# Patient Record
Sex: Female | Born: 1965 | Hispanic: No | Marital: Single | Smoking: Current every day smoker
Health system: Southern US, Community
[De-identification: ages and names within clinical notes are randomized; demographics above are authoritative.]

## PROBLEM LIST (undated history)

## (undated) DIAGNOSIS — F32A Depression, unspecified: Secondary | ICD-10-CM

## (undated) DIAGNOSIS — G43909 Migraine, unspecified, not intractable, without status migrainosus: Secondary | ICD-10-CM

## (undated) DIAGNOSIS — G35 Multiple sclerosis: Secondary | ICD-10-CM

## (undated) DIAGNOSIS — R42 Dizziness and giddiness: Secondary | ICD-10-CM

## (undated) DIAGNOSIS — E119 Type 2 diabetes mellitus without complications: Secondary | ICD-10-CM

## (undated) DIAGNOSIS — F329 Major depressive disorder, single episode, unspecified: Secondary | ICD-10-CM

## (undated) DIAGNOSIS — G35D Multiple sclerosis, unspecified: Secondary | ICD-10-CM

---

## 2004-10-19 ENCOUNTER — Emergency Department (HOSPITAL_COMMUNITY): Admission: EM | Admit: 2004-10-19 | Discharge: 2004-10-19 | Payer: Self-pay | Admitting: Emergency Medicine

## 2005-01-19 ENCOUNTER — Other Ambulatory Visit: Admission: RE | Admit: 2005-01-19 | Discharge: 2005-01-19 | Payer: Self-pay | Admitting: Internal Medicine

## 2006-07-04 ENCOUNTER — Encounter: Admission: RE | Admit: 2006-07-04 | Discharge: 2006-07-04 | Payer: Self-pay | Admitting: Internal Medicine

## 2006-07-06 ENCOUNTER — Encounter: Admission: RE | Admit: 2006-07-06 | Discharge: 2006-07-06 | Payer: Self-pay | Admitting: *Deleted

## 2008-10-27 ENCOUNTER — Emergency Department (HOSPITAL_COMMUNITY): Admission: EM | Admit: 2008-10-27 | Discharge: 2008-10-27 | Payer: Self-pay | Admitting: Emergency Medicine

## 2010-02-20 ENCOUNTER — Emergency Department (HOSPITAL_COMMUNITY): Admission: EM | Admit: 2010-02-20 | Discharge: 2010-02-21 | Payer: Self-pay | Admitting: Emergency Medicine

## 2010-10-23 LAB — URINALYSIS, ROUTINE W REFLEX MICROSCOPIC
Glucose, UA: NEGATIVE mg/dL
Hgb urine dipstick: NEGATIVE
Leukocytes, UA: NEGATIVE
Protein, ur: 30 mg/dL — AB
Specific Gravity, Urine: 1.031 — ABNORMAL HIGH (ref 1.005–1.030)

## 2010-10-23 LAB — URINE MICROSCOPIC-ADD ON

## 2010-10-23 LAB — POCT PREGNANCY, URINE: Preg Test, Ur: NEGATIVE

## 2011-01-05 ENCOUNTER — Other Ambulatory Visit: Payer: Self-pay | Admitting: Obstetrics

## 2011-01-05 DIAGNOSIS — Z1231 Encounter for screening mammogram for malignant neoplasm of breast: Secondary | ICD-10-CM

## 2011-01-14 ENCOUNTER — Ambulatory Visit (HOSPITAL_COMMUNITY): Payer: Self-pay | Attending: Obstetrics

## 2014-03-20 ENCOUNTER — Other Ambulatory Visit (HOSPITAL_COMMUNITY)
Admission: RE | Admit: 2014-03-20 | Discharge: 2014-03-20 | Disposition: A | Discharge status: Home / Self Care | Payer: Medicaid Other | Source: Ambulatory Visit | Attending: Family Medicine | Admitting: Family Medicine

## 2014-03-20 DIAGNOSIS — Z01419 Encounter for gynecological examination (general) (routine) without abnormal findings: Secondary | ICD-10-CM | POA: Diagnosis present

## 2014-03-20 DIAGNOSIS — Z1151 Encounter for screening for human papillomavirus (HPV): Secondary | ICD-10-CM | POA: Diagnosis present

## 2014-03-20 DIAGNOSIS — N760 Acute vaginitis: Secondary | ICD-10-CM | POA: Diagnosis present

## 2014-03-20 DIAGNOSIS — Z113 Encounter for screening for infections with a predominantly sexual mode of transmission: Secondary | ICD-10-CM | POA: Diagnosis present

## 2015-07-15 ENCOUNTER — Ambulatory Visit: Payer: Medicaid Other | Admitting: *Deleted

## 2016-05-26 ENCOUNTER — Ambulatory Visit
Admission: RE | Admit: 2016-05-26 | Discharge: 2016-05-26 | Disposition: A | Discharge status: Home / Self Care | Payer: Medicaid Other | Source: Ambulatory Visit | Attending: Family Medicine | Admitting: Family Medicine

## 2016-05-26 ENCOUNTER — Other Ambulatory Visit: Payer: Self-pay | Admitting: Family Medicine

## 2016-05-26 DIAGNOSIS — Z87891 Personal history of nicotine dependence: Secondary | ICD-10-CM

## 2016-05-26 DIAGNOSIS — F17200 Nicotine dependence, unspecified, uncomplicated: Secondary | ICD-10-CM

## 2016-10-26 ENCOUNTER — Emergency Department (HOSPITAL_COMMUNITY)
Admission: EM | Admit: 2016-10-26 | Discharge: 2016-10-27 | Disposition: A | Discharge status: Home / Self Care | Payer: Medicaid Other | Attending: Emergency Medicine | Admitting: Emergency Medicine

## 2016-10-26 ENCOUNTER — Encounter (HOSPITAL_COMMUNITY): Payer: Self-pay

## 2016-10-26 DIAGNOSIS — E119 Type 2 diabetes mellitus without complications: Secondary | ICD-10-CM | POA: Insufficient documentation

## 2016-10-26 DIAGNOSIS — F17200 Nicotine dependence, unspecified, uncomplicated: Secondary | ICD-10-CM | POA: Diagnosis not present

## 2016-10-26 DIAGNOSIS — R42 Dizziness and giddiness: Secondary | ICD-10-CM | POA: Insufficient documentation

## 2016-10-26 DIAGNOSIS — Z79899 Other long term (current) drug therapy: Secondary | ICD-10-CM | POA: Diagnosis not present

## 2016-10-26 DIAGNOSIS — Z7984 Long term (current) use of oral hypoglycemic drugs: Secondary | ICD-10-CM | POA: Diagnosis not present

## 2016-10-26 HISTORY — DX: Type 2 diabetes mellitus without complications: E11.9

## 2016-10-26 HISTORY — DX: Migraine, unspecified, not intractable, without status migrainosus: G43.909

## 2016-10-26 HISTORY — DX: Dizziness and giddiness: R42

## 2016-10-26 HISTORY — DX: Depression, unspecified: F32.A

## 2016-10-26 HISTORY — DX: Multiple sclerosis: G35

## 2016-10-26 HISTORY — DX: Multiple sclerosis, unspecified: G35.D

## 2016-10-26 HISTORY — DX: Major depressive disorder, single episode, unspecified: F32.9

## 2016-10-26 LAB — BASIC METABOLIC PANEL
ANION GAP: 8 (ref 5–15)
BUN: 12 mg/dL (ref 6–20)
CHLORIDE: 105 mmol/L (ref 101–111)
CO2: 29 mmol/L (ref 22–32)
Calcium: 9.7 mg/dL (ref 8.9–10.3)
Creatinine, Ser: 0.88 mg/dL (ref 0.44–1.00)
GFR calc non Af Amer: >60 mL/min (ref >60)
Glucose, Bld: 119 mg/dL — ABNORMAL HIGH (ref 65–99)
POTASSIUM: 3.6 mmol/L (ref 3.5–5.1)
SODIUM: 142 mmol/L (ref 135–145)

## 2016-10-26 LAB — URINALYSIS, ROUTINE W REFLEX MICROSCOPIC
BILIRUBIN URINE: NEGATIVE
Glucose, UA: NEGATIVE mg/dL
Hgb urine dipstick: NEGATIVE
KETONES UR: NEGATIVE mg/dL
LEUKOCYTES UA: NEGATIVE
NITRITE: NEGATIVE
Protein, ur: NEGATIVE mg/dL
SPECIFIC GRAVITY, URINE: 1.02 (ref 1.005–1.030)
pH: 5 (ref 5.0–8.0)

## 2016-10-26 LAB — CBC
HEMATOCRIT: 42.6 % (ref 36.0–46.0)
Hemoglobin: 13.9 g/dL (ref 12.0–15.0)
MCH: 28.2 pg (ref 26.0–34.0)
MCHC: 32.6 g/dL (ref 30.0–36.0)
MCV: 86.4 fL (ref 78.0–100.0)
Platelets: 260 10*3/uL (ref 150–400)
RBC: 4.93 MIL/uL (ref 3.87–5.11)
RDW: 12.8 % (ref 11.5–15.5)
WBC: 7.2 10*3/uL (ref 4.0–10.5)

## 2016-10-26 MED ORDER — DIAZEPAM 5 MG PO TABS: 5.0000 mg | ORAL_TABLET | Freq: Once | ORAL | Status: DC

## 2016-10-26 MED ORDER — MECLIZINE HCL 25 MG PO TABS
25.0000 mg | ORAL_TABLET | Freq: Once | ORAL | Status: AC
  Administered 2016-10-27: 25 mg via ORAL
  Filled 2016-10-26: qty 1

## 2016-10-26 MED ORDER — SODIUM CHLORIDE 0.9 % IV BOLUS (SEPSIS)
1000.0000 mL | Freq: Once | INTRAVENOUS | Status: AC
  Administered 2016-10-27: 1000 mL via INTRAVENOUS

## 2016-10-26 NOTE — ED Provider Notes (Signed)
[facility]-EMERGENCY DEPT Provider Note   CSN: [id number] Arrival date & time: 10/26/16  4098  By signing my name below, I, Teofilo Pod, attest that this documentation has been prepared under the direction and in the presence of Shon Baton, MD . Electronically Signed: Teofilo Pod, ED Scribe. 10/26/2016. 11:27 PM.    History   Chief Complaint Chief Complaint  Patient presents with  . Dizziness   The history is provided by the patient. No language interpreter was used.   HPI Comments:  Sophia Nguyen is a 51 y.o. female with PMHx of vertigo and MS who presents to the Emergency Department complaining of constant dizziness x 2 weeks. Pt descries the dizziness as "extreme" and similar to "car/sea sickness," and states that she has similar symptoms when she has migraines, but denies any current migraine. She complains of associated nausea, and pain behind her ears, neck and shoulders for several week. She states that her current dizziness and nausea is different from previous episodes of vertigo. She states that the dizziness is made worse with moving her head rapidly and looking at screens. Pt has MS and takes Tecfidera, and states that she normally has general weakness, but denies having any recent MS flare ups. Her PCP states that her current pain is not associated with her MS. Pt is a smoker but does not drink EtOH. No alleviating factors noted. Pt denies any congestion.   Past Medical History:  Diagnosis Date  . Depression   . Diabetes mellitus without complication (HCC)   . Migraine   . Multiple sclerosis (HCC)   . Vertigo     There are no active problems to display for this patient.   History reviewed. No pertinent surgical history.  OB History    No data available       Home Medications    Prior to Admission medications   Medication Sig Start Date End Date Taking? Authorizing Provider  atorvastatin (LIPITOR) 40 MG tablet Take 40 mg by mouth every  evening.   Yes Historical Provider, MD  buPROPion (WELLBUTRIN XL) 150 MG 24 hr tablet Take 150 mg by mouth daily. 10/07/16  Yes Historical Provider, MD  Cholecalciferol (VITAMIN D3 PO) Take 1 tablet by mouth daily.   Yes Historical Provider, MD  Cyanocobalamin (VITAMIN B-12 PO) Take 1 tablet by mouth daily.   Yes Historical Provider, MD  Dimethyl Fumarate 240 MG CPDR Take 240 mg by mouth 2 (two) times daily. 09/15/16  Yes Historical Provider, MD  lisdexamfetamine (VYVANSE) 70 MG capsule Take 70 mg by mouth daily. 10/26/16  Yes Historical Provider, MD  Melatonin 3 MG TABS Take 3 mg by mouth at bedtime.   Yes Historical Provider, MD  metFORMIN (GLUCOPHAGE) 850 MG tablet Take 850 mg by mouth daily. 10/07/16  Yes Historical Provider, MD  NEXIUM 40 MG capsule Take 40 mg by mouth daily. 10/11/16  Yes Historical Provider, MD  VIIBRYD 20 MG TABS Take 20 mg by mouth daily. 10/14/16  Yes Historical Provider, MD    Family History No family history on file.  Social History Social History  Substance Use Topics  . Smoking status: Current Every Day Smoker  . Smokeless tobacco: Never Used  . Alcohol use No     Allergies   Patient has no known allergies.   Review of Systems Review of Systems  Constitutional: Negative for fever.  HENT: Negative for congestion.   Gastrointestinal: Positive for nausea.  Musculoskeletal: Positive for myalgias and neck  pain.  Neurological: Positive for dizziness and weakness.  All other systems reviewed and are negative.    Physical Exam Updated Vital Signs BP 102/76   Pulse 95   Temp 97.8 F (36.6 C) (Oral)   Resp 16   Ht 4' 11.5" (1.511 m)   Wt 183 lb (83 kg)   SpO2 93%   BMI 36.34 kg/m   Physical Exam  Constitutional: She is oriented to person, place, and time. She appears well-developed and well-nourished. No distress.  HENT:  Head: Normocephalic and atraumatic.  Eyes: EOM are normal. Pupils are equal, round, and reactive to light.  No nystagmus noted    Neck: Normal range of motion. Neck supple.  Cardiovascular: Normal rate, regular rhythm and normal heart sounds.   No murmur heard. Pulmonary/Chest: Effort normal and breath sounds normal. No respiratory distress. She has no wheezes.  Abdominal: Soft. There is no tenderness.  Neurological: She is alert and oriented to person, place, and time.  Cranial nerves II through XII intact, 5 out of 5 strength in all 4 extremities, no dysmetria to finger-nose-finger  Skin: Skin is warm and dry.  Psychiatric: She has a normal mood and affect.  Nursing note and vitals reviewed.    ED Treatments / Results  DIAGNOSTIC STUDIES:  Oxygen Saturation is 97% on RA, normal by my interpretation.    COORDINATION OF CARE:  11:27 PM Discussed treatment plan with pt at bedside and pt agreed to plan.   Labs (all labs ordered are listed, but only abnormal results are displayed) Labs Reviewed  BASIC METABOLIC PANEL - Abnormal; Notable for the following:       Result Value   Glucose, Bld 119 (*)    All other components within normal limits  CBC  URINALYSIS, ROUTINE W REFLEX MICROSCOPIC    EKG  EKG Interpretation  Date/Time:  Wednesday October 26 2016 20:20:51 EDT Ventricular Rate:  84 PR Interval:  126 QRS Duration: 86 QT Interval:  362 QTC Calculation: 427 R Axis:   30 Text Interpretation:  Normal sinus rhythm Nonspecific T wave abnormality Abnormal ECG Confirmed by Wilkie AyeHORTON  MD, Toni AmendOURTNEY ([id number]) on 10/26/2016 11:59:18 PM       Radiology Mr Brain Wo Contrast  Result Date: 10/27/2016 CLINICAL DATA:  Multiple sclerosis and vertigo EXAM: MRI HEAD WITHOUT CONTRAST TECHNIQUE: Multiplanar, multiecho pulse sequences of the brain and surrounding structures were obtained without intravenous contrast. COMPARISON:  Head CT 02/20/2010 FINDINGS: Brain: No focal diffusion restriction to indicate acute infarct. No intraparenchymal hemorrhage. There are numerous (greater than 20) hyperintense T2 weighted signal  lesions within the periventricular, deep and subcortical white matter, many of which are pericallosal and oriented perpendicularly to the long axis of the lateral ventricles. There is a small lesion within the pons. No infratentorial lesions are otherwise identified. No mass lesion or midline shift. No hydrocephalus or extra-axial fluid collection. The midline structures are normal. No age advanced or lobar predominant atrophy. Vascular: Major intracranial arterial and venous sinus flow voids are preserved. No evidence of chronic microhemorrhage or amyloid angiopathy. Skull and upper cervical spine: The visualized skull base, calvarium, upper cervical spine and extracranial soft tissues are normal. Sinuses/Orbits: No fluid levels or advanced mucosal thickening. No mastoid effusion. Normal orbits. IMPRESSION: 1. Numerous (greater than 20) white matter lesions in a pattern consistent with multiple sclerosis. 2. No acute ischemia. Electronically Signed   By: Deatra RobinsonKevin  Herman M.D.   On: 10/27/2016 00:57    Procedures Procedures (including critical care time)  Medications Ordered in ED Medications  diazepam (VALIUM) tablet 5 mg (5 mg Oral Not Given 10/27/16 0304)  sodium chloride 0.9 % bolus 1,000 mL (0 mLs Intravenous Stopped 10/27/16 0305)  meclizine (ANTIVERT) tablet 25 mg (25 mg Oral Given 10/27/16 0201)  prochlorperazine (COMPAZINE) injection 10 mg (10 mg Intravenous Given 10/27/16 0201)  diphenhydrAMINE (BENADRYL) injection 25 mg (25 mg Intravenous Given 10/27/16 0201)  dexamethasone (DECADRON) injection 10 mg (10 mg Intravenous Given 10/27/16 0205)     Initial Impression / Assessment and Plan / ED Course  I have reviewed the triage vital signs and the nursing notes.  Pertinent labs & imaging results that were available during my care of the patient were reviewed by me and considered in my medical decision making (see chart for details).  Clinical Course as of Oct 28 339  Thu Oct 27, 2016  0106 MRI  shows consistent with MS. No prior for comparison. On recheck, no improvement with Valium and meclizine. Patient was dosed Compazine, Benadryl, and Decadron. Will reassess.  [CH]    Clinical Course User Index [CH] Shon Baton, MD    A shunt present with dizziness. Ongoing for 2 weeks. Reports history of the same. Unclear correlation with her MS. She is nonfocal on exam. No signs or symptoms of cerebellar dysfunction. MRI was obtained given her history of MS. MRI just shows lesions consistent with MS. No prior for comparison. Patient continued to have dizziness and was redosed Compazine, Benadryl, and Decadron.  On recheck, patient states that she feels much better. She is ambulatory independently. She is neuro intact. Encouraged close follow-up with her neurologist.  After history, exam, and medical workup I feel the patient has been appropriately medically screened and is safe for discharge home. Pertinent diagnoses were discussed with the patient. Patient was given return precautions.   Final Clinical Impressions(s) / ED Diagnoses   Final diagnoses:  Dizziness    New Prescriptions New Prescriptions   No medications on file   I personally performed the services described in this documentation, which was scribed in my presence. The recorded information has been reviewed and is accurate.     Shon Baton, MD 10/27/16 [phone]

## 2016-10-26 NOTE — ED Triage Notes (Signed)
Pt states that for the past two weeks she has been having dizziness, pt states that the dizziness is better when she lies down and cuts off the lights, similar to her migraines, pt endorses that she also has vertigo. States pain in shoulder, behind ears and neck.

## 2016-10-26 NOTE — ED Notes (Signed)
Pt transported to MRI 

## 2016-10-26 NOTE — ED Notes (Signed)
EDP at bedside  

## 2016-10-26 NOTE — ED Notes (Signed)
Pt up to nurse first desk, requesting update. Provided answers to questions and apologized for wait.

## 2016-10-27 ENCOUNTER — Emergency Department (HOSPITAL_COMMUNITY): Payer: Medicaid Other

## 2016-10-27 MED ORDER — DEXAMETHASONE SODIUM PHOSPHATE 4 MG/ML IJ SOLN
10.0000 mg | Freq: Once | INTRAMUSCULAR | Status: AC
  Administered 2016-10-27: 10 mg via INTRAVENOUS
  Filled 2016-10-27: qty 3

## 2016-10-27 MED ORDER — DIPHENHYDRAMINE HCL 50 MG/ML IJ SOLN
25.0000 mg | Freq: Once | INTRAMUSCULAR | Status: AC
  Administered 2016-10-27: 25 mg via INTRAVENOUS
  Filled 2016-10-27: qty 1

## 2016-10-27 MED ORDER — PROCHLORPERAZINE EDISYLATE 5 MG/ML IJ SOLN
10.0000 mg | Freq: Once | INTRAMUSCULAR | Status: AC
  Administered 2016-10-27: 10 mg via INTRAVENOUS
  Filled 2016-10-27: qty 2

## 2016-10-27 NOTE — ED Notes (Signed)
Iv started  Med given orthostatics

## 2016-10-27 NOTE — Discharge Instructions (Signed)
You were seen today for dizziness. You improved with vertigo medications. It is unclear whether your dizziness is related to your MS or peripheral vertigo. Follow-up with your neurologist closely. If you have any new or worsening symptoms you need to be reevaluated.

## 2016-10-27 NOTE — ED Notes (Signed)
Pt returned from MRI °

## 2016-10-27 NOTE — ED Notes (Signed)
Passes swallow screen

## 2017-11-10 IMAGING — MR MR HEAD W/O CM
9 of 11 series · 31 of 48 positions shown · non-contrast
Comparison: Head CT 02/20/2010

CLINICAL DATA: Multiple sclerosis and vertigo

EXAM:
MRI HEAD WITHOUT CONTRAST
TECHNIQUE: Multiplanar, multiecho pulse sequences of the brain and surrounding
structures were obtained without intravenous contrast.

[Series 2: FLAIR · sagittal · 5.0mm · 0.47mm/px · 1 of 23 slices shown (1 of 3)]
[im 1/23]
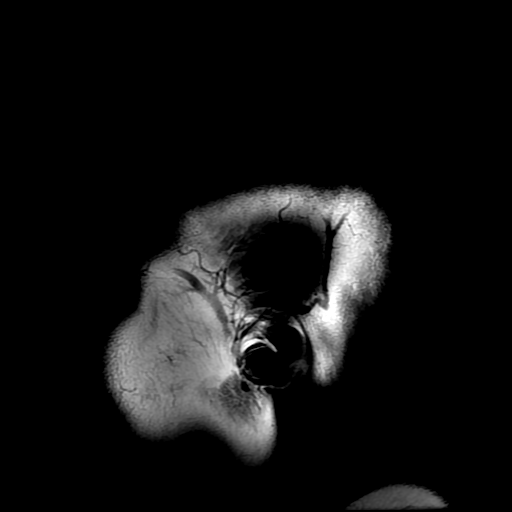

[Series 4: DWI · axial · 3.0mm · 0.94mm/px · z∈[-25,+122]mm · 7 of 100 slices shown (1 of 2)]
[im 1/100]
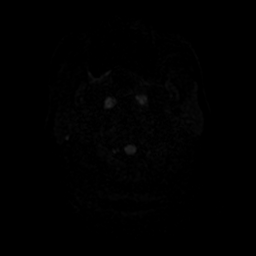
[im 17/100]
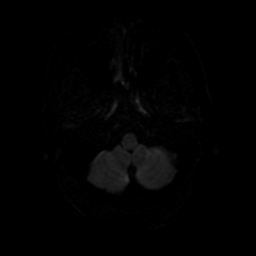
[im 34/100]
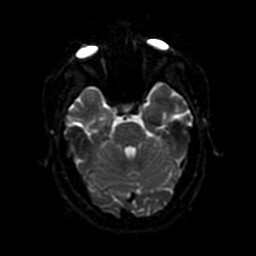
[im 50/100]
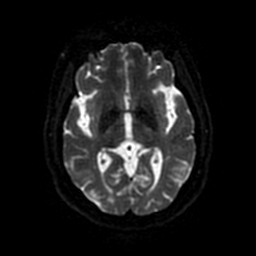
[im 67/100]
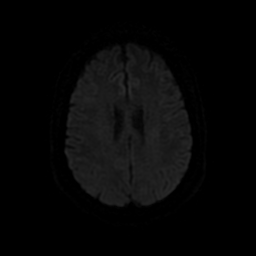
[im 83/100]
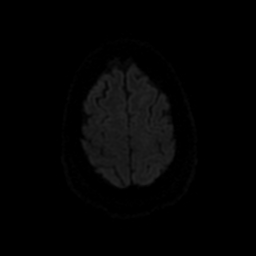
[im 100/100]
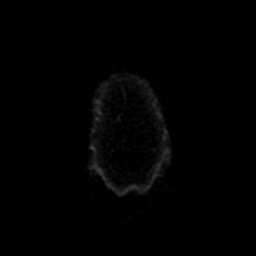

[Series 5: T2 · axial · 5.0mm · 0.43mm/px · z∈[-24,+120]mm · 2 of 25 slices shown (1 of 2)]
[im 1/25]
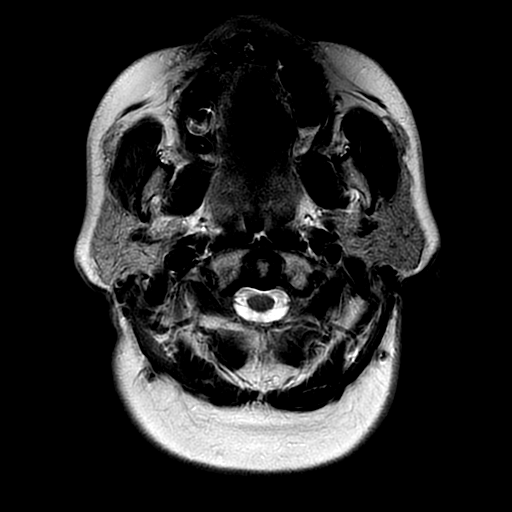
[im 25/25]
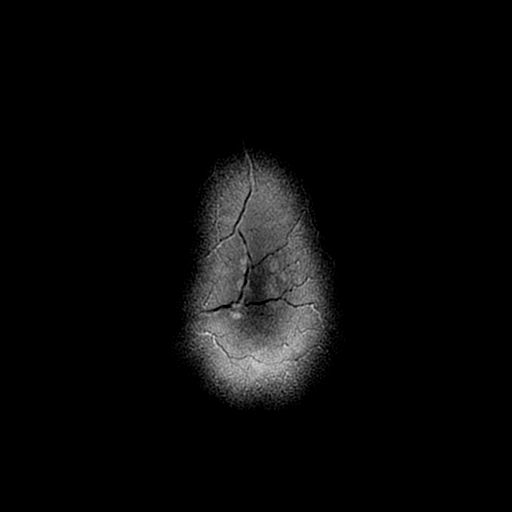

[Series 6: FLAIR · axial · 5.0mm · 0.43mm/px · z∈[-24,+120]mm · 2 of 25 slices shown (2 of 3)]
[im 1/25]
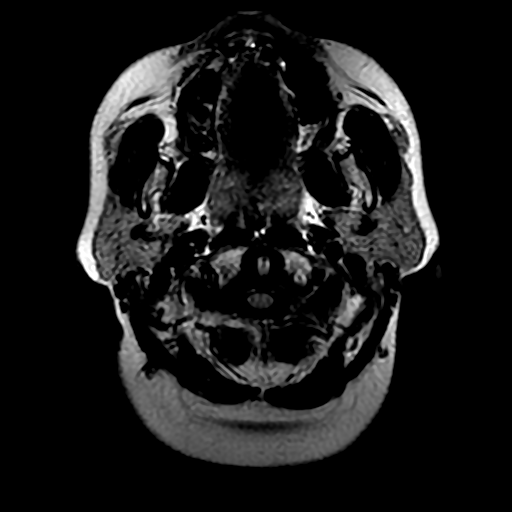
[im 25/25]
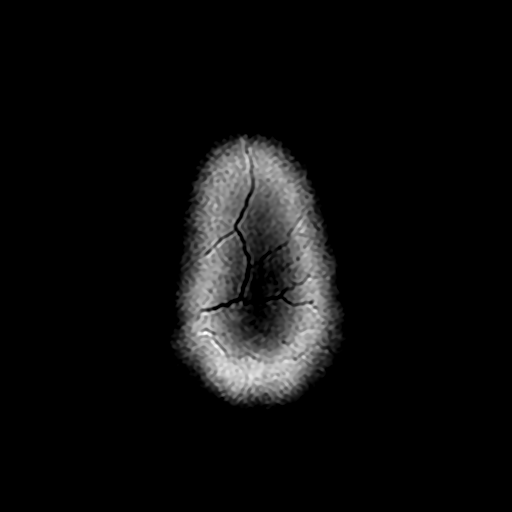

[Series 7: DWI · coronal · 4.0mm · 0.94mm/px · 5 of 72 slices shown (2 of 2)]
[im 1/72]
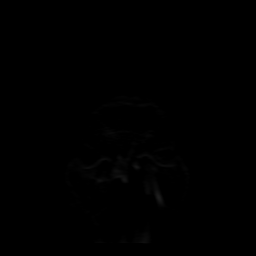
[im 18/72]
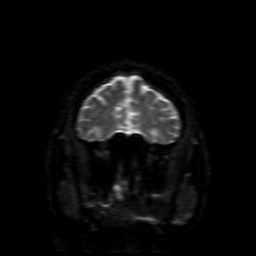
[im 36/72]
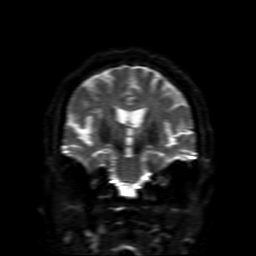
[im 54/72]
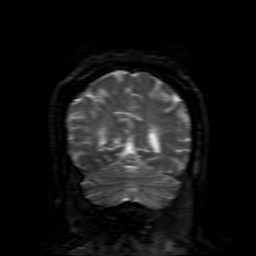
[im 72/72]
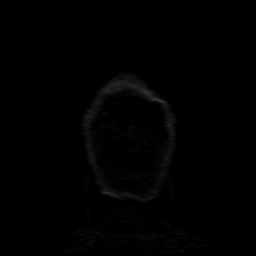

[Series 10: T2 · coronal · 5.0mm · 0.39mm/px · 1 of 30 slices shown (2 of 2)]
[im 1/30]
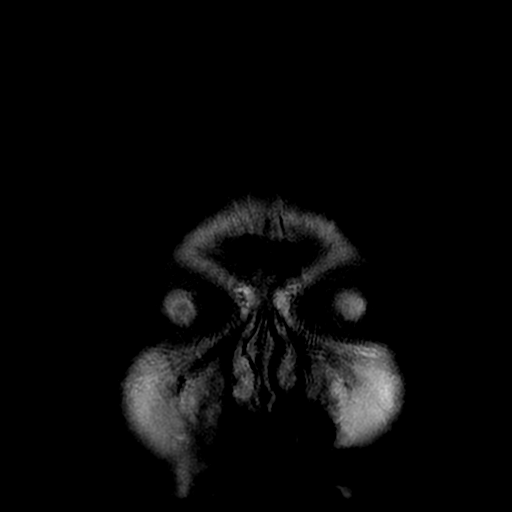

[Series 11: FLAIR · sagittal · 1.6mm · 0.49mm/px · 8 of 216 slices shown (3 of 3)]
[im 1/216]
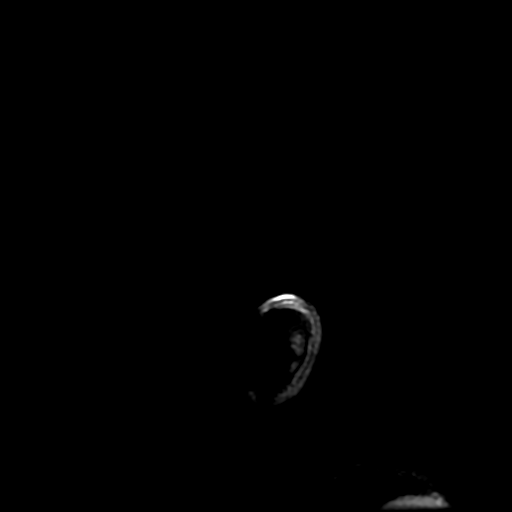
[im 34/216]
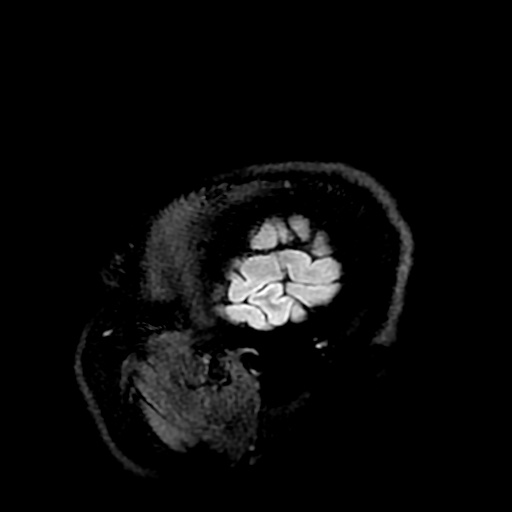
[im 67/216]
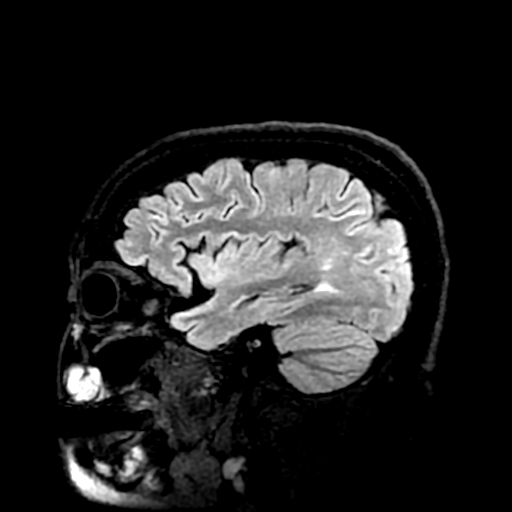
[im 100/216]
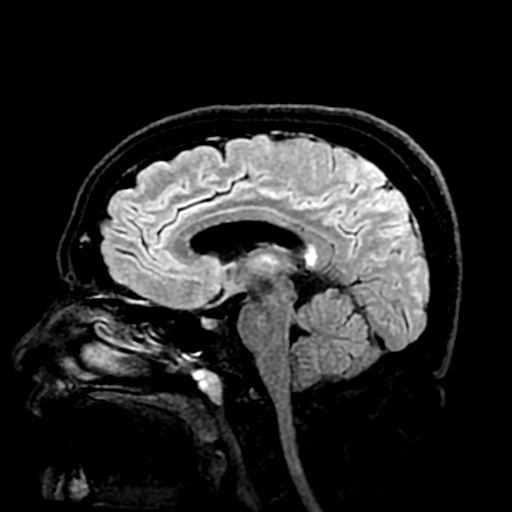
[im 116/216]
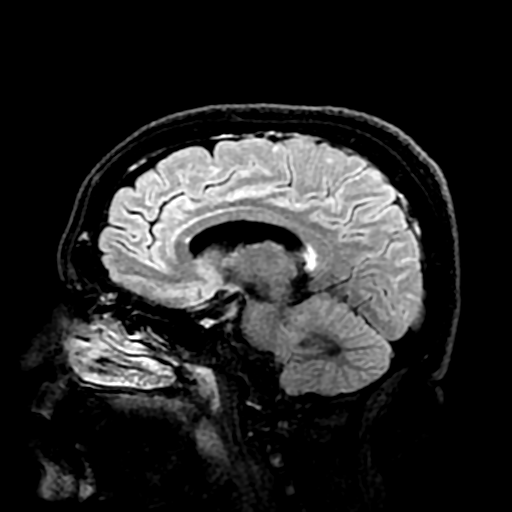
[im 149/216]
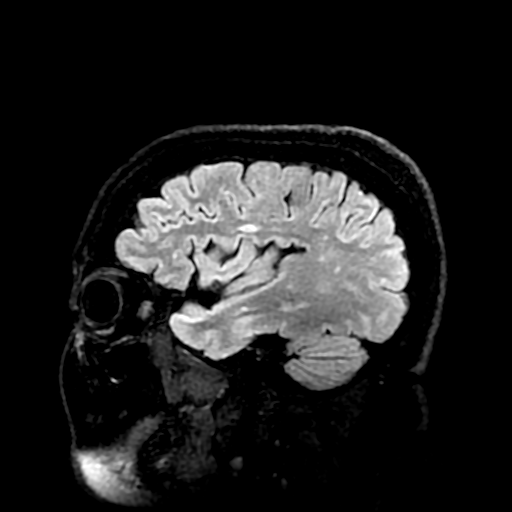
[im 182/216]
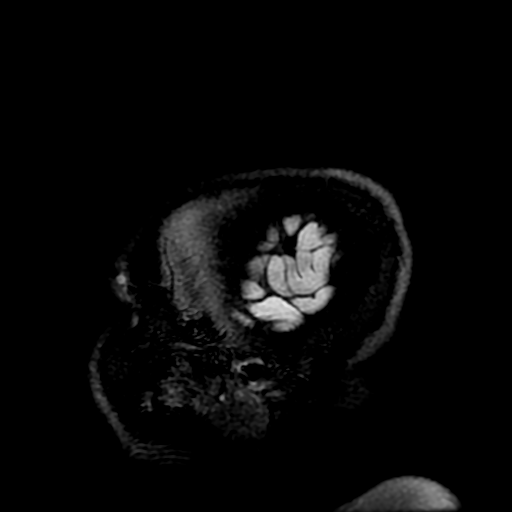
[im 216/216]
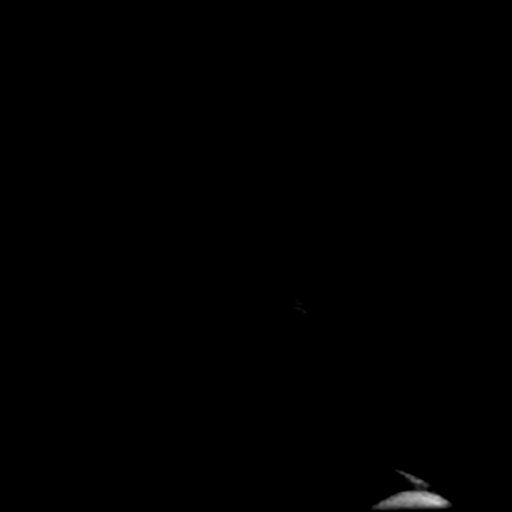

[Series 450: ADC · axial · 3.0mm · 0.94mm/px · z∈[-25,+122]mm · 3 of 50 slices shown (1 of 2)]
[im 1/50]
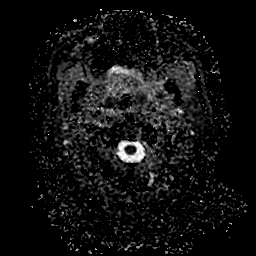
[im 25/50]
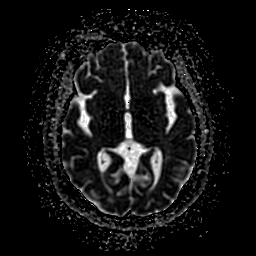
[im 50/50]
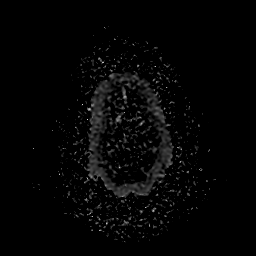

[Series 750: ADC · coronal · 4.0mm · 0.94mm/px · 2 of 36 slices shown (2 of 2)]
[im 1/36]
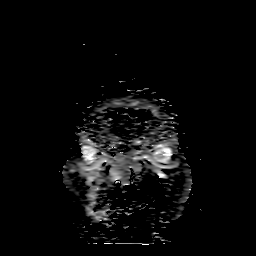
[im 36/36]
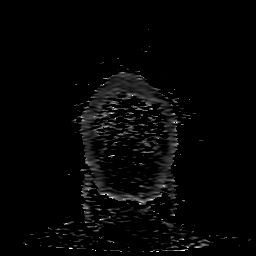

[31 of 48 positions shown; findings below may reference images not displayed]

FINDINGS: Brain: No focal diffusion restriction to indicate acute infarct. No
intraparenchymal hemorrhage. There are numerous (greater than 20)
hyperintense T2 weighted signal lesions within the periventricular,
deep and subcortical white matter, many of which are pericallosal
and oriented perpendicularly to the long axis of the lateral
ventricles. There is a small lesion within the pons. No
infratentorial lesions are otherwise identified. No mass lesion or
midline shift. No hydrocephalus or extra-axial fluid collection. The
midline structures are normal. No age advanced or lobar predominant
atrophy.

Vascular: Major intracranial arterial and venous sinus flow voids
are preserved. No evidence of chronic microhemorrhage or amyloid
angiopathy.

Skull and upper cervical spine: The visualized skull base,
calvarium, upper cervical spine and extracranial soft tissues are
normal.

Sinuses/Orbits: No fluid levels or advanced mucosal thickening. No
mastoid effusion. Normal orbits.
IMPRESSION: 1. Numerous (greater than 20) white matter lesions in a pattern
consistent with multiple sclerosis.
2. No acute ischemia.

## 2023-08-13 ENCOUNTER — Other Ambulatory Visit: Payer: Self-pay

## 2023-08-13 ENCOUNTER — Encounter (HOSPITAL_COMMUNITY): Payer: Self-pay

## 2023-08-13 ENCOUNTER — Emergency Department (HOSPITAL_COMMUNITY)
Admission: EM | Admit: 2023-08-13 | Discharge: 2023-08-14 | Discharge status: Against Medical Advice | Payer: MEDICAID | Attending: Student | Admitting: Student

## 2023-08-13 DIAGNOSIS — Z5321 Procedure and treatment not carried out due to patient leaving prior to being seen by health care provider: Secondary | ICD-10-CM | POA: Insufficient documentation

## 2023-08-13 DIAGNOSIS — M25511 Pain in right shoulder: Secondary | ICD-10-CM | POA: Diagnosis not present

## 2023-08-13 DIAGNOSIS — R519 Headache, unspecified: Secondary | ICD-10-CM | POA: Diagnosis present

## 2023-08-13 DIAGNOSIS — Y92009 Unspecified place in unspecified non-institutional (private) residence as the place of occurrence of the external cause: Secondary | ICD-10-CM | POA: Diagnosis not present

## 2023-08-13 DIAGNOSIS — M542 Cervicalgia: Secondary | ICD-10-CM | POA: Insufficient documentation

## 2023-08-13 DIAGNOSIS — W01198A Fall on same level from slipping, tripping and stumbling with subsequent striking against other object, initial encounter: Secondary | ICD-10-CM | POA: Diagnosis not present

## 2023-08-13 NOTE — ED Provider Triage Note (Signed)
 Emergency Medicine Provider Triage Evaluation Note  Sophia Nguyen , a 58 y.o. female  was evaluated in triage.  Pt complains of left-sided head pain, neck pain, right shoulder pain secondary to a fall.  Patient reports drinking approximate 2 glasses of wine earlier in the evening and going to bed.  She does not been presenting there following but remembers waking up in a friend's arms.  She believes she may have hit her head on a bedside table.  She denies nausea, vomiting, shortness of breath, chest pain  Review of Systems  Positive:  Negative:   Physical Exam  BP 101/75   Pulse 85   Temp 97.8 F (36.6 C) (Oral)   Resp 16   Ht 4' 11 (1.499 m)   Wt 77.1 kg   SpO2 97%   BMI 34.34 kg/m  Gen:   Awake, no distress   Resp:  Normal effort  MSK:   Moves extremities without difficulty  Other:    Medical Decision Making  Medically screening exam initiated at 11:56 PM.  Appropriate orders placed.  Trinidad Funda Haueter was informed that the remainder of the evaluation will be completed by another provider, this initial triage assessment does not replace that evaluation, and the importance of remaining in the ED until their evaluation is complete.     Logan Ubaldo NOVAK, PA-C 08/13/23 2357

## 2023-08-13 NOTE — ED Triage Notes (Addendum)
 Patient is coming from a friends house. Had 2 glasses of wine. Patient was laying in bed, got up to use the bathroom and fell hitting the left side of her head. No nausea or vision changes. No blood thinners. Hx of MS

## 2023-08-14 ENCOUNTER — Emergency Department (HOSPITAL_COMMUNITY): Payer: MEDICAID

## 2023-08-14 LAB — CBC WITH DIFFERENTIAL/PLATELET
Abs Immature Granulocytes: 0.03 10*3/uL (ref 0.00–0.07)
Basophils Absolute: 0.1 10*3/uL (ref 0.0–0.1)
Basophils Relative: 1 %
Eosinophils Absolute: 0.2 10*3/uL (ref 0.0–0.5)
Eosinophils Relative: 2 %
HCT: 43.3 % (ref 36.0–46.0)
Hemoglobin: 14.2 g/dL (ref 12.0–15.0)
Immature Granulocytes: 0 %
Lymphocytes Relative: 45 %
Lymphs Abs: 4.1 10*3/uL — ABNORMAL HIGH (ref 0.7–4.0)
MCH: 30.4 pg (ref 26.0–34.0)
MCHC: 32.8 g/dL (ref 30.0–36.0)
MCV: 92.7 fL (ref 80.0–100.0)
Monocytes Absolute: 0.6 10*3/uL (ref 0.1–1.0)
Monocytes Relative: 7 %
Neutro Abs: 4.1 10*3/uL (ref 1.7–7.7)
Neutrophils Relative %: 45 %
Platelets: 243 10*3/uL (ref 150–400)
RBC: 4.67 MIL/uL (ref 3.87–5.11)
RDW: 14.2 % (ref 11.5–15.5)
WBC: 9.1 10*3/uL (ref 4.0–10.5)
nRBC: 0 % (ref 0.0–0.2)

## 2023-08-14 LAB — COMPREHENSIVE METABOLIC PANEL
ALT: 14 U/L (ref 0–44)
AST: 16 U/L (ref 15–41)
Albumin: 3.3 g/dL — ABNORMAL LOW (ref 3.5–5.0)
Alkaline Phosphatase: 52 U/L (ref 38–126)
Anion gap: 12 (ref 5–15)
BUN: 18 mg/dL (ref 6–20)
CO2: 22 mmol/L (ref 22–32)
Calcium: 8.9 mg/dL (ref 8.9–10.3)
Chloride: 107 mmol/L (ref 98–111)
Creatinine, Ser: 1.07 mg/dL — ABNORMAL HIGH (ref 0.44–1.00)
GFR, Estimated: >60 mL/min (ref >60)
Glucose, Bld: 93 mg/dL (ref 70–99)
Potassium: 3.6 mmol/L (ref 3.5–5.1)
Sodium: 141 mmol/L (ref 135–145)
Total Bilirubin: 0.5 mg/dL (ref 0.0–1.2)
Total Protein: 6.4 g/dL — ABNORMAL LOW (ref 6.5–8.1)

## 2023-08-14 LAB — ETHANOL: Alcohol, Ethyl (B): 37 mg/dL — ABNORMAL HIGH (ref <10)

## 2023-08-14 NOTE — ED Notes (Signed)
 Pt stated she wants to leave and will call doctor in am. Pt seen leaving the ED

## 2023-08-14 NOTE — ED Notes (Signed)
With Xray 

## 2024-02-23 ENCOUNTER — Encounter: Payer: Self-pay | Admitting: Advanced Practice Midwife
